# Patient Record
Sex: Male | Born: 1955 | Hispanic: No | Marital: Married | Smoking: Never smoker
Health system: Southern US, Community
[De-identification: ages and names within clinical notes are randomized; demographics above are authoritative.]

---

## 2015-10-25 ENCOUNTER — Ambulatory Visit (INDEPENDENT_AMBULATORY_CARE_PROVIDER_SITE_OTHER): Payer: BLUE CROSS/BLUE SHIELD

## 2015-10-25 ENCOUNTER — Ambulatory Visit (INDEPENDENT_AMBULATORY_CARE_PROVIDER_SITE_OTHER): Payer: BLUE CROSS/BLUE SHIELD | Admitting: Sports Medicine

## 2015-10-25 DIAGNOSIS — M5136 Other intervertebral disc degeneration, lumbar region: Secondary | ICD-10-CM | POA: Diagnosis not present

## 2015-10-25 DIAGNOSIS — M545 Low back pain: Secondary | ICD-10-CM

## 2015-10-25 DIAGNOSIS — M51369 Other intervertebral disc degeneration, lumbar region without mention of lumbar back pain or lower extremity pain: Secondary | ICD-10-CM | POA: Insufficient documentation

## 2015-10-25 MED ORDER — MELOXICAM 15 MG PO TABS: ORAL_TABLET | ORAL | 3 refills | Status: DC

## 2015-10-25 MED ORDER — METHYLPREDNISOLONE SODIUM SUCC 125 MG IJ SOLR
125.0000 mg | Freq: Once | INTRAMUSCULAR | Status: AC
  Administered 2015-10-25: 125 mg via INTRAMUSCULAR

## 2015-10-25 MED ORDER — KETOROLAC TROMETHAMINE 30 MG/ML IJ SOLN
30.0000 mg | Freq: Once | INTRAMUSCULAR | Status: AC
  Administered 2015-10-25: 30 mg via INTRAMUSCULAR

## 2015-10-25 MED ORDER — PREDNISONE 50 MG PO TABS: ORAL_TABLET | ORAL | 0 refills | Status: DC

## 2015-10-25 NOTE — Assessment & Plan Note (Signed)
Classic discogenic symptoms. Physical therapy, prednisone, meloxicam, x-rays. Referral to physical therapy and chiropractic care. Solu-Medrol 125 and Toradol 30 mg given intramuscular.

## 2015-10-25 NOTE — Progress Notes (Signed)
   Subjective:    I'm seeing this patient as a consultation for:  Rada Hayasey Melvin PA-C  CC: Low back pain  HPI: For several days this pleasant 60 year old male has had increasing pain in his low back, worse with sitting, flexion, Valsalva. No radicular pain, constitutional symptoms, trauma, no saddle numbness. No bowel or bladder dysfunction with the exception of difficulty stooling when he Valsalvas, secondary to pain.  Past medical history, Surgical history, Family history not pertinant except as noted below, Social history, Allergies, and medications have been entered into the medical record, reviewed, and no changes needed.   Review of Systems: No headache, visual changes, nausea, vomiting, diarrhea, constipation, dizziness, abdominal pain, skin rash, fevers, chills, night sweats, weight loss, swollen lymph nodes, body aches, joint swelling, muscle aches, chest pain, shortness of breath, mood changes, visual or auditory hallucinations.   Objective:   General: Well Developed, well nourished, and in no acute distress.  Neuro/Psych: Alert and oriented x3, extra-ocular muscles intact, able to move all 4 extremities, sensation grossly intact. Skin: Warm and dry, no rashes noted.  Respiratory: Not using accessory muscles, speaking in full sentences, trachea midline.  Cardiovascular: Pulses palpable, no extremity edema. Abdomen: Does not appear distended. Back Exam:  Inspection: Unremarkable  Motion: Limited significantly to just slight flexion, extension and rotation is okay. SLR laying: Produces back pain but not radiating leg pain  XSLR laying: Negative  Palpable tenderness: None. FABER: negative. Sensory change: Gross sensation intact to all lumbar and sacral dermatomes.  Reflexes: 2+ at both patellar tendons, 2+ at achilles tendons, Babinski's downgoing.  Strength at foot  Plantar-flexion: 5/5 Dorsi-flexion: 5/5 Eversion: 5/5 Inversion: 5/5  Leg strength  Quad: 5/5 Hamstring: 5/5 Hip  flexor: 5/5 Hip abductors: 5/5  Gait unremarkable.  Impression and Recommendations:   This case required medical decision making of moderate complexity.  Lumbar degenerative disc disease Classic discogenic symptoms. Physical therapy, prednisone, meloxicam, x-rays. Referral to physical therapy and chiropractic care. Solu-Medrol 125 and Toradol 30 mg given intramuscular.

## 2015-11-06 ENCOUNTER — Ambulatory Visit (INDEPENDENT_AMBULATORY_CARE_PROVIDER_SITE_OTHER): Payer: BLUE CROSS/BLUE SHIELD | Admitting: Rehabilitative and Restorative Service Providers"

## 2015-11-06 ENCOUNTER — Encounter: Payer: Self-pay | Admitting: Rehabilitative and Restorative Service Providers"

## 2015-11-06 DIAGNOSIS — M545 Low back pain, unspecified: Secondary | ICD-10-CM

## 2015-11-06 DIAGNOSIS — R29898 Other symptoms and signs involving the musculoskeletal system: Secondary | ICD-10-CM | POA: Diagnosis not present

## 2015-11-06 DIAGNOSIS — R293 Abnormal posture: Secondary | ICD-10-CM | POA: Diagnosis not present

## 2015-11-06 NOTE — Patient Instructions (Signed)
Abdominal Bracing With Pelvic Floor (Hook-Lying)    With neutral spine, tighten pelvic floor and abdominals sucking belly button to back bone; tighten muscles in low back at waist. Hold 10 sec  Repeat _10__ times. Do _several__ times a day. Progress to do this in sitting; standing; walking and with any lifting   Copyright  VHI. All rights reserved.    Trunk: Prone Extension (Press-Ups)    Lie on stomach on firm, flat surface. Relax bottom and legs. Raise chest in air with elbows straight. Keep hips flat on surface, sag stomach. Hold _2___ seconds. Repeat __10__ times. Do __2-3__ sessions per day. CAUTION: Movement should be gentle and slow.   HIP: Hamstrings - Supine   Place strap around foot. Raise leg up, keeping knee straight.  Bend opposite knee to protect back if indicated. Hold 30 seconds. 3 reps per set, 2-3 sets per day     Outer Hip Stretch: Reclined IT Band Stretch (Strap)   Strap around one foot, pull leg across body until you feel a pull or stretch, with shoulders on mat. Hold for 30 seconds. Repeat 3 times each leg. 2-3 times/day.  Piriformis Stretch   Lying on back, pull right knee toward opposite shoulder. Hold 30 seconds. Repeat 3 times. Do 2-3 sessions per day.  Quads / HF, Prone   Lie face down. Grasp one ankle with same-side hand. Use towel if needed to reach. Gently pull foot toward buttock.  Hold 30 seconds. Repeat 3 times per session. Do 2-3 sessions per day.  Sleeping on Back  Place pillow under knees. A pillow with cervical support and a roll around waist are also helpful. Copyright  VHI. All rights reserved.  Sleeping on Side Place pillow between knees. Use cervical support under neck and a roll around waist as needed. Copyright  VHI. All rights reserved.   Sleeping on Stomach   If this is the only desirable sleeping position, place pillow under lower legs, and under stomach or chest as needed.  Posture - Sitting   Sit upright,  head facing forward. Try using a roll to support lower back. Keep shoulders relaxed, and avoid rounded back. Keep hips level with knees. Avoid crossing legs for long periods. Stand to Sit / Sit to Stand   To sit: Bend knees to lower self onto front edge of chair, then scoot back on seat. To stand: Reverse sequence by placing one foot forward, and scoot to front of seat. Use rocking motion to stand up.   Work Height and Reach  Ideal work height is no more than 2 to 4 inches below elbow level when standing, and at elbow level when sitting. Reaching should be limited to arm's length, with elbows slightly bent.  Bending  Bend at hips and knees, not back. Keep feet shoulder-width apart.    Posture - Standing   Good posture is important. Avoid slouching and forward head thrust. Maintain curve in low back and align ears over shoul- ders, hips over ankles.  Alternating Positions   Alternate tasks and change positions frequently to reduce fatigue and muscle tension. Take rest breaks. Computer Work   Position work to Art gallery managerface forward. Use proper work and seat height. Keep shoulders back and down, wrists straight, and elbows at right angles. Use chair that provides full back support. Add footrest and lumbar roll as needed.  Getting Into / Out of Car  Lower self onto seat, scoot back, then bring in one leg at a time. Reverse sequence  to get out.  Dressing  Lie on back to pull socks or slacks over feet, or sit and bend leg while keeping back straight.    Housework - Sink  Place one foot on ledge of cabinet under sink when standing at sink for prolonged periods.   Pushing / Pulling  Pushing is preferable to pulling. Keep back in proper alignment, and use leg muscles to do the work.  Deep Squat   Squat and lift with both arms held against upper trunk. Tighten stomach muscles without holding breath. Use smooth movements to avoid jerking.  Avoid Twisting   Avoid twisting or bending  back. Pivot around using foot movements, and bend at knees if needed when reaching for articles.  Carrying Luggage   Distribute weight evenly on both sides. Use a cart whenever possible. Do not twist trunk. Move body as a unit.   Lifting Principles .Maintain proper posture and head alignment. .Slide object as close as possible before lifting. .Move obstacles out of the way. .Test before lifting; ask for help if too heavy. .Tighten stomach muscles without holding breath. .Use smooth movements; do not jerk. .Use legs to do the work, and pivot with feet. .Distribute the work load symmetrically and close to the center of trunk. .Push instead of pull whenever possible.   Ask For Help   Ask for help and delegate to others when possible. Coordinate your movements when lifting together, and maintain the low back curve.  Log Roll   Lying on back, bend left knee and place left arm across chest. Roll all in one movement to the right. Reverse to roll to the left. Always move as one unit. Housework - Sweeping  Use long-handled equipment to avoid stooping.   Housework - Wiping  Position yourself as close as possible to reach work surface. Avoid straining your back.  Laundry - Unloading Wash   To unload small items at bottom of washer, lift leg opposite to arm being used to reach.  Gardening - Raking  Move close to area to be raked. Use arm movements to do the work. Keep back straight and avoid twisting.     Cart  When reaching into cart with one arm, lift opposite leg to keep back straight.   Getting Into / Out of Bed  Lower self to lie down on one side by raising legs and lowering head at the same time. Use arms to assist moving without twisting. Bend both knees to roll onto back if desired. To sit up, start from lying on side, and use same move-ments in reverse. Housework - Vacuuming  Hold the vacuum with arm held at side. Step back and forth to move it, keeping head up.  Avoid twisting.   Laundry - Armed forces training and education officer so that bending and twisting can be avoided.   Laundry - Unloading Dryer  Squat down to reach into clothes dryer or use a reacher.  Gardening - Weeding / Psychiatric nurse or Kneel. Knee pads may be helpful.                    TENS UNIT: This is helpful for muscle pain and spasm.   Search and Purchase a TENS 7000 2nd edition at www.tenspros.com. It should be less than $30.     TENS unit instructions: Do not shower or bathe with the unit on Turn the unit off before removing electrodes or batteries If the electrodes lose stickiness add a drop of  water to the electrodes after they are disconnected from the unit and place on plastic sheet. If you continued to have difficulty, call the TENS unit company to purchase more electrodes. Do not apply lotion on the skin area prior to use. Make sure the skin is clean and dry as this will help prolong the life of the electrodes. After use, always check skin for unusual red areas, rash or other skin difficulties. If there are any skin problems, does not apply electrodes to the same area. Never remove the electrodes from the unit by pulling the wires. Do not use the TENS unit or electrodes other than as directed. Do not change electrode placement without consultating your therapist or physician. Keep 2 fingers with between each electrode.

## 2015-11-06 NOTE — Therapy (Signed)
[facility] [address] [place], [place], [address] Phone: [phone]   Fax:  [phone]  Physical Therapy Evaluation  Patient Details  Name: Marcus BurkeManojkumar Amratlal Myers MRN: [id number] Date of Birth: 1955-09-18 Referring Provider: Dr. Benjamin Stainhekkekandam  Encounter Date: 11/06/2015      PT End of Session - 11/06/15 1504    Visit Number 1   Number of Visits 12   Date for PT Re-Evaluation 12/19/15   PT Start Time 0211   PT Stop Time 0308   PT Time Calculation (min) 57 min   Activity Tolerance Patient tolerated treatment well      History reviewed. No pertinent past medical history.  History reviewed. No pertinent surgical history.  There were no vitals filed for this visit.       Subjective Assessment - 11/06/15 1417    Subjective Patient reports that he has problems bending forward to touch toes; sitting in floor for more than 5-10 min; bending forward to tie shoes. Symptoms have been present for the past two weeks. he was bent forward removing water from a fountain for 15-20 min. He has some pain with valsalva.    Pertinent History Denies any back or musculoskeletal problems.   How long can you sit comfortably? 5-10 min sitting cross legged; no problem kneeling; in a chair > 30 min; sleeping on his stomach   How long can you stand comfortably? no problems    How long can you walk comfortably? no problems    Diagnostic tests xray - WNL's    Patient Stated Goals get rid of the back pain    Currently in Pain? No/denies   Pain Location Back   Pain Orientation Lower;Left;Right   Pain Descriptors / Indicators Aching   Pain Type Acute pain   Pain Radiating Towards none   Pain Onset 1 to 4 weeks ago   Pain Frequency Intermittent   Aggravating Factors  bending forward; sitting cross legged; prolonged sitting    Pain Relieving Factors changing positions             [facility] - 11/06/15 0001      Assessment   Medical Diagnosis Lumbar DDD   Referring Provider Dr. Benjamin Stainhekkekandam   Onset Date/Surgical Date 10/23/15   Hand Dominance Right   Next MD Visit 9/17 - 4 weeks    Prior Therapy none     Precautions   Precautions None     Balance Screen   Has the patient fallen in the past 6 months No   Has the patient had a decrease in activity level because of a fear of falling?  No   Is the patient reluctant to leave their home because of a fear of falling?  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    Leisure sedentary lifestyle watches TV; some yard work when his back is not hurting      Observation/Other Assessments   Focus on Therapeutic Outcomes (FOTO)  37% limitation      Sensation   Additional Comments WNL's per pt report      Posture/Postural Control   Posture Comments head forward; shoulders rounded and elevated; increased thoracic kyphosis; decresaed lumbar lordosis     AROM   Lumbar Flexion  90%  discomfort   Lumbar Extension 30%   Lumbar - Right Side Bend 55%  pull Lt side   Lumbar - Left Side Bend 55%  discomfort Lt side   Lumbar - Right Rotation 50%  discomfort Lt    Lumbar - Left Rotation 55%  discomfort Lt      Strength   Overall Strength Comments 5/5 bilat LE's      Flexibility   Hamstrings tight 80 deg Rt; 75 deg Lt    Quadriceps tight bilat   ITB tight bilat Lt > Rt    Piriformis tight bilat Lt > Rt      Palpation   Spinal mobility mild discomfort L5/S1 with CPA mobs Grade III/ (-) UPA    Palpation comment tender and tight Lt > Rt piriformis/hip abductor musculature                    [facility] Adult PT Treatment/Exercise - 11/06/15 0001      Lumbar Exercises: Stretches   Passive Hamstring Stretch 2 reps;30 seconds   Press Ups --  2-3 sec x  10   Quad Stretch 2 reps;30 seconds   ITB Stretch 2 reps;30 seconds   Piriformis Stretch 2 reps;30 seconds     Lumbar Exercises: Supine   Ab Set --  3 part core 10 sec x 10      Moist Heat Therapy   Number Minutes Moist Heat 15 Minutes   Moist Heat Location Lumbar Spine     Electrical Stimulation   Electrical Stimulation Location bilat lumbar spine    Electrical Stimulation Action IFC   Electrical Stimulation Parameters to tolerance   Electrical Stimulation Goals Pain;Tone                PT Education - 11/06/15 1503    Education provided Yes   Education Details initiated spine care; HEP   Person(s) Educated Patient   Methods Explanation;Demonstration;Tactile cues;Verbal cues;Handout   Comprehension Verbalized understanding;Returned demonstration;Verbal cues required;Tactile cues required             PT Long Term Goals - 11/06/15 1413      PT LONG TERM GOAL #1   Title Improve posture and alignment with patient to demonstrate more upright posture  12/19/15   Time 6   Period Weeks   Status New     PT LONG TERM GOAL #2   Title Increase LE flexibility to WFL's and equal bilat 12/19/15   Time 6   Period Weeks   Status New     PT LONG TERM GOAL #3   Title Decrease pain with patient reporting ability to sit cross legged for 10-15 min without pain or limitation 12/19/15   Time 6   Period Weeks   Status New     PT LONG TERM GOAL #4   Title Independent in HEP 12/19/15   Time 6   Period Weeks   Status New     PT LONG TERM GOAL #5   Title Improve FOTO to </= 21% limitation 12/19/15   Time 6   Period Weeks   Status New               Plan - 11/06/15 1504    Clinical Impression Statement Patient presents with central LBP of ~2 weeks duration.Ssymptoms are intermittent in nature and related to forward flexed postures. Patient began to experience symptoms after being bent forward removing water from a fountain for 15-20 min. He has limited  spinal mobilty;  tightness through bilat LE's; pain with spring testing through the lumbar spine; poor core stability; limited understanding of body mechanics and spine care.    Rehab Potential Good   PT Frequency 2x / week   PT Duration 6 weeks   PT Treatment/Interventions Patient/family education;ADLs/Self Care Home Management;Cryotherapy;Electrical Stimulation;Iontophoresis 4mg /ml Dexamethasone;Moist Heat;Traction;Ultrasound;Manual techniques;Dry needling;Therapeutic activities;Therapeutic exercise   PT Next Visit Plan progress wit hcore stabilization with focus on extension; back education re posture; alignment; lifting    Consulted and Agree with Plan of Care Patient      Patient will benefit from skilled therapeutic intervention in order to improve the following deficits and impairments:  Postural dysfunction, Improper body mechanics, Pain, Decreased range of motion, Decreased mobility, Decreased activity tolerance  Visit Diagnosis: Bilateral low back pain without sciatica - Plan: PT plan of care cert/re-cert  Other symptoms and signs involving the musculoskeletal system - Plan: PT plan of care cert/re-cert  Abnormal posture - Plan: PT plan of care cert/re-cert     Problem List Patient Active Problem List   Diagnosis Date Noted  . Lumbar degenerative disc disease 10/25/2015    Karrington Studnicka Rober MinionP Cathan Gearin PT, MPH  11/06/2015, 3:13 PM  [facility] [address] [place], [place], [address] Phone: [phone]   Fax:  [phone]  Name: Marcus BurkeManojkumar Amratlal Myers MRN: [id number] Date of Birth: 1955/12/30

## 2015-11-13 ENCOUNTER — Ambulatory Visit (INDEPENDENT_AMBULATORY_CARE_PROVIDER_SITE_OTHER): Payer: BLUE CROSS/BLUE SHIELD | Admitting: Physical Therapy

## 2015-11-13 DIAGNOSIS — M545 Low back pain, unspecified: Secondary | ICD-10-CM

## 2015-11-13 DIAGNOSIS — R293 Abnormal posture: Secondary | ICD-10-CM | POA: Diagnosis not present

## 2015-11-13 DIAGNOSIS — R29898 Other symptoms and signs involving the musculoskeletal system: Secondary | ICD-10-CM

## 2015-11-13 NOTE — Therapy (Addendum)
[address] [place], [place], [address] Phone: [phone]   Fax:  [phone]  Physical Therapy Treatment  Patient Details  Name: Tierra Divelbiss MRN: [id number] Date of Birth: 09-Dec-1955 Referring Provider: Dr. Helane Rima  Encounter Date: 11/13/2015      PT End of Session - 11/13/15 1608    Visit Number 2   Number of Visits 12   Date for PT Re-Evaluation 12/19/15   PT Start Time 1600   PT Stop Time 0109   PT Time Calculation (min) 53 min   Activity Tolerance Patient tolerated treatment well;No increased pain      No past medical history on file.  No past surgical history on file.  There were no vitals filed for this visit.      Subjective Assessment - 11/13/15 1604    Subjective Pt reports he has no problem standing all day at work, but continues to have pain with touching feet to put shoes on or sitting for extended period of time.   Patient Stated Goals get rid of the back pain    Currently in Pain? Yes   Pain Score 3    Pain Location Back   Pain Orientation Right;Left   Pain Descriptors / Indicators Aching   Aggravating Factors  bending forward    Pain Relieving Factors changing positions, ice            [facility] PT Assessment - 11/13/15 0001      Assessment   Medical Diagnosis Lumbar DDD   Referring Provider Dr. Helane Rima   Onset Date/Surgical Date 10/23/15   Hand Dominance Right   Next MD Visit 9/17 - 4 weeks    Prior Therapy none     Flexibility   Hamstrings Rt/Lt deg 90 deg (opp leg bent)            [facility] Adult PT Treatment/Exercise - 11/13/15 0001      Lumbar Exercises: Stretches   Passive Hamstring Stretch 3 reps;30 seconds   Prone on Elbows Stretch 5 reps;10 seconds   Press Ups 3 reps  3 sec hold. Switched to POE   [employer] 2 reps;30 seconds   ITB Stretch 2 reps;30 seconds   Piriformis Stretch 2 reps;30 seconds     Lumbar Exercises: Aerobic    Stationary Bike NuStep L4: 5 min      Lumbar Exercises: Seated   Sit to Stand 5 reps  with ab set      Lumbar Exercises: Supine   Ab Set 10 reps;5 seconds     Modalities   Modalities Electrical Stimulation;Moist Heat     Moist Heat Therapy   Number Minutes Moist Heat 15 Minutes   Moist Heat Location Lumbar Spine     Electrical Stimulation   Electrical Stimulation Location bilat lumbar spine    Electrical Stimulation Action IFC   Electrical Stimulation Parameters to tolerance    Electrical Stimulation Goals Pain;Tone         Education     Educated pt on back care including supine to/from sit; lumbar support in sitting and avoiding agrivating factors like forward trunk flexion with straight legs. Pt verbalized understanding.                 PT Long Term Goals - 11/13/15 1643      PT LONG TERM GOAL #1   Title Improve posture and alignment with patient to demonstrate more upright posture  12/19/15   Time 6  Period Weeks   Status On-going     PT LONG TERM GOAL #2   Title Increase LE flexibility to WFL's and equal bilat 12/19/15   Time 6   Period Weeks   Status On-going     PT LONG TERM GOAL #3   Title Decrease pain with patient reporting ability to sit cross legged for 10-15 min without pain or limitation 12/19/15   Time 6   Period Weeks   Status On-going     PT LONG TERM GOAL #4   Title Independent in HEP 12/19/15   Time 6   Period Weeks   Status On-going     PT LONG TERM GOAL #5   Title Improve FOTO to </= 21% limitation 12/19/15   Time 6   Period Weeks               Plan - 11/13/15 1631    Clinical Impression Statement Pt required some minor cues on correct form of exercise today.  He reported decrease in back pain with stretches and further reduction with estim/MHP.  Pt re-educated on spine care and body mechanics.  No goals met yet, only second visit.    Rehab Potential Good   PT Frequency 2x / week   PT Duration 6 weeks   PT  Treatment/Interventions Patient/family education;ADLs/Self Care Home Management;Cryotherapy;Electrical Stimulation;Iontophoresis 69m/ml Dexamethasone;Moist Heat;Traction;Ultrasound;Manual techniques;Dry needling;Therapeutic activities;Therapeutic exercise   PT Next Visit Plan progress with core stabilization with focus on extension; back education re posture; alignment; lifting    Consulted and Agree with Plan of Care Patient      Patient will benefit from skilled therapeutic intervention in order to improve the following deficits and impairments:  Postural dysfunction, Improper body mechanics, Pain, Decreased range of motion, Decreased mobility, Decreased activity tolerance  Visit Diagnosis: Bilateral low back pain without sciatica  Other symptoms and signs involving the musculoskeletal system  Abnormal posture     Problem List Patient Active Problem List   Diagnosis Date Noted  . Lumbar degenerative disc disease 10/25/2015   JKerin Perna PTA 11/13/15 4:52 PM  [facility] [place] [address]Phone: [phone]  Fax:  [phone] Name: MBraheem TomasikMRN: [id number] of Birth: 1August 09, 1957 PHYSICAL THERAPY DISCHARGE SUMMARY  Visits from Start of Care: 2  Current functional level related to goals / functional outcomes: unknown   Remaining deficits: unknown   Education / Equipment: HEP Plan: Patient agrees to discharge.  Patient goals were not met. Patient is being discharged due to not returning since the last visit.  ?????     Celyn P. HHelene KelpPT, MPH 02/11/16 9:49 AM

## 2015-11-15 ENCOUNTER — Encounter: Payer: BLUE CROSS/BLUE SHIELD | Admitting: Rehabilitative and Restorative Service Providers"

## 2018-06-13 IMAGING — DX DG LUMBAR SPINE COMPLETE 4+V
5 series · 5 of 5 positions shown · non-contrast
Comparison: None in PACs

CLINICAL DATA: Low back pain for several days with no known injury.
No radicular symptoms.

EXAM:
LUMBAR SPINE - COMPLETE 4+ VIEW

[l-spine ap]
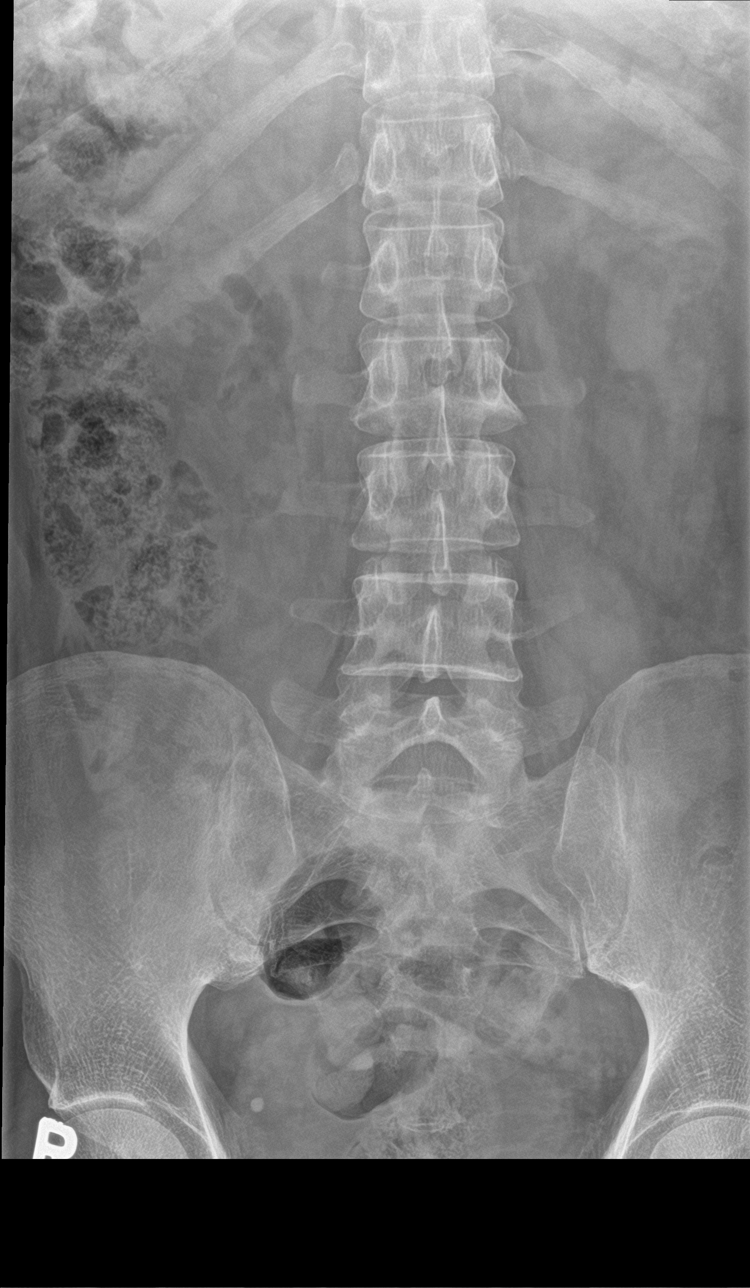

[l-spine obl (1 of 2)]
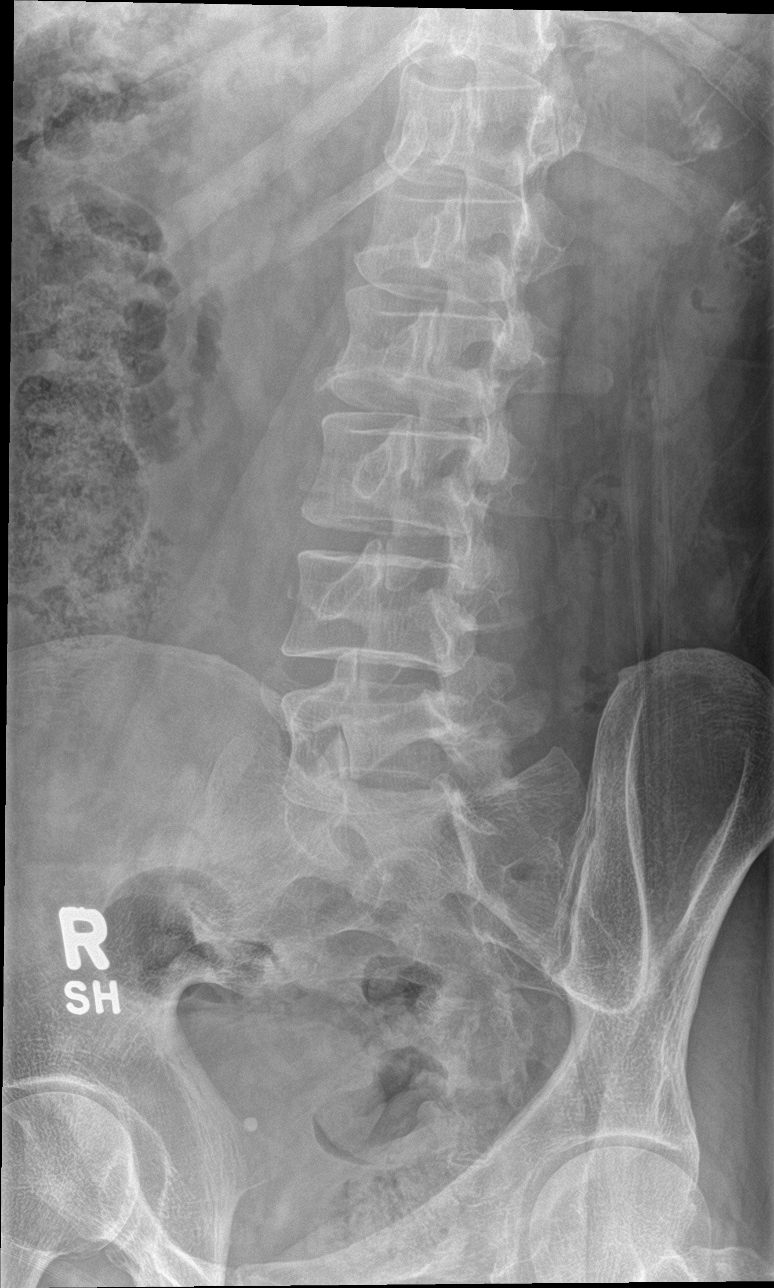

[l-spine obl (2 of 2)]
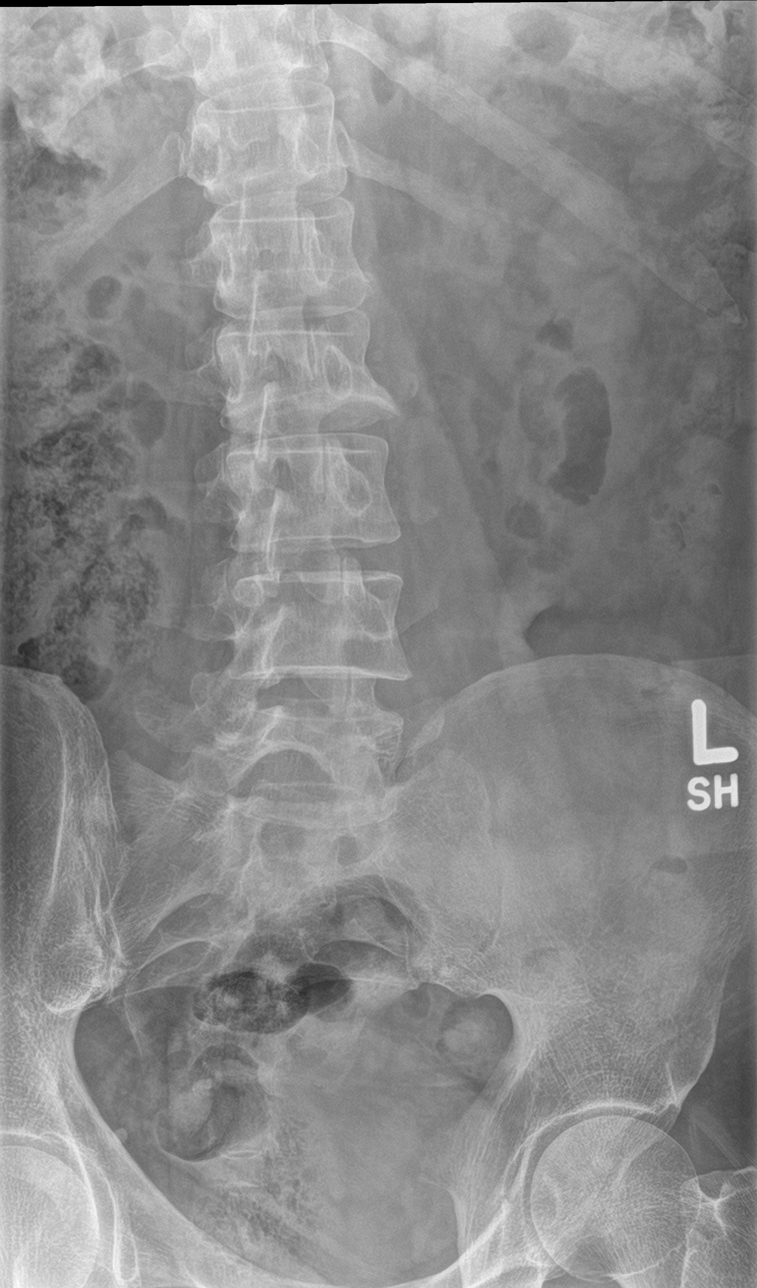

[l-spine lat]
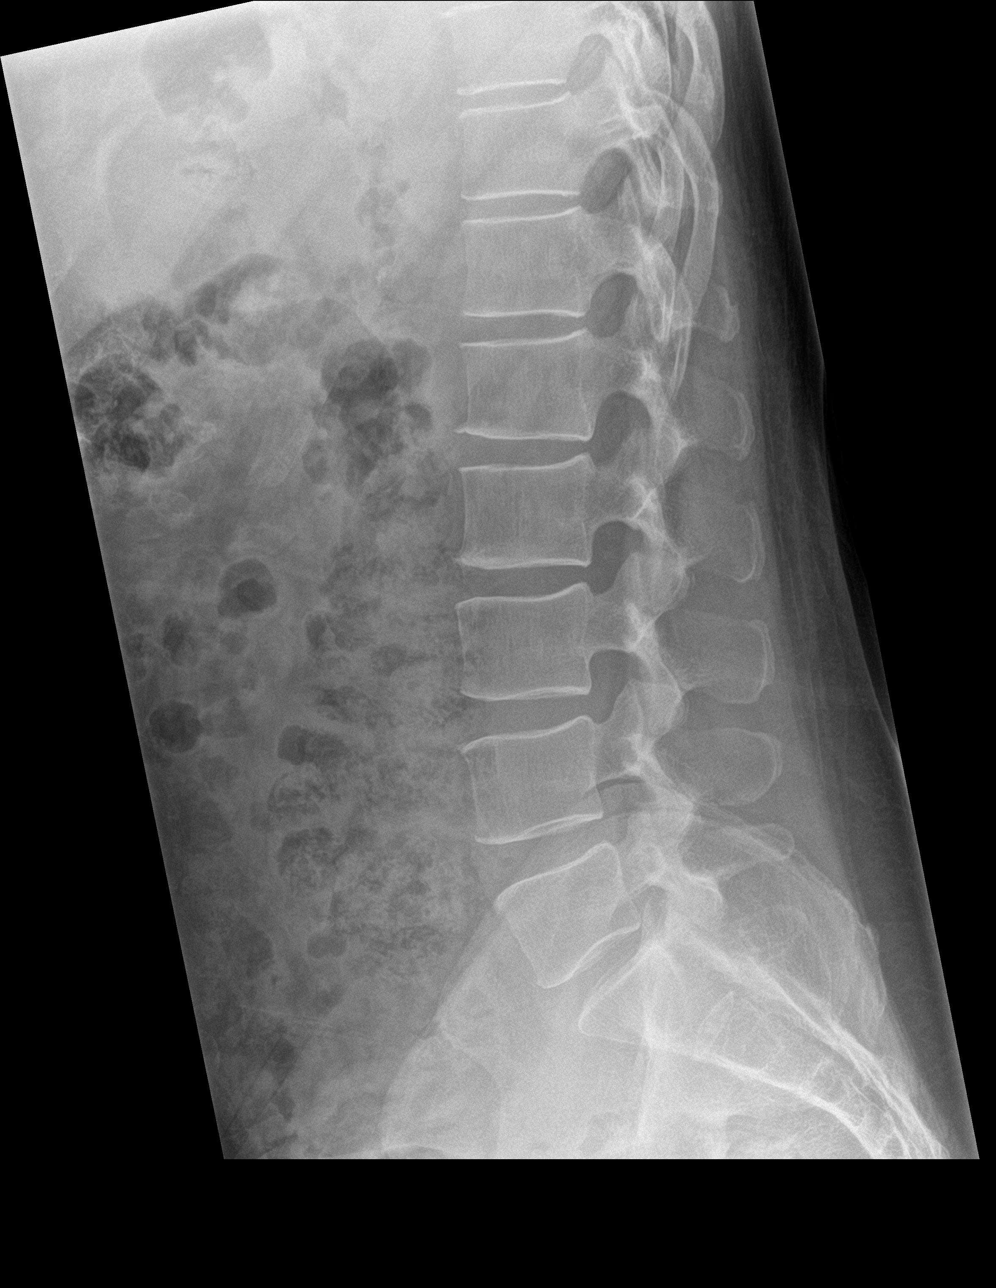

[l-spine spot]
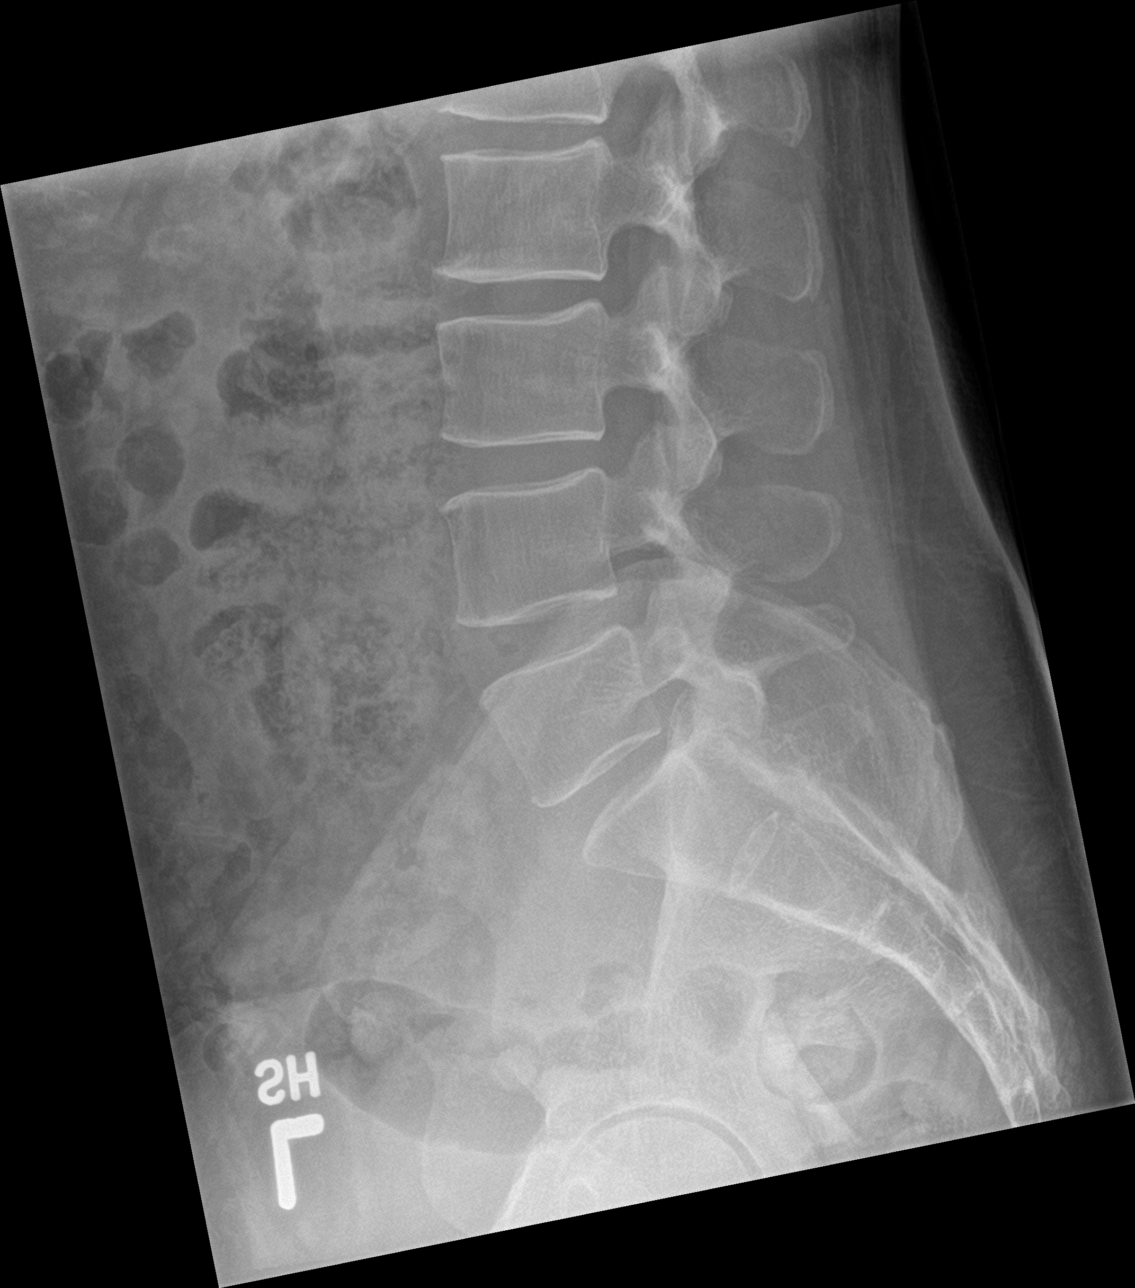

[5 of 5 positions shown; findings below may reference images not displayed]

FINDINGS: The bones are subjectively adequately mineralized. The vertebral
bodies are preserved in height. The pedicles and transverse
processes are intact. There is no pars defect and no
spondylolisthesis. The disc space heights are well maintained. The
observed portions of the sacrum are normal.
IMPRESSION: There is no acute or significant chronic bony abnormality of the
lumbar spine.

## 2018-10-17 ENCOUNTER — Other Ambulatory Visit: Payer: Self-pay

## 2018-10-17 ENCOUNTER — Encounter: Payer: Self-pay | Admitting: Family Medicine

## 2018-10-17 ENCOUNTER — Ambulatory Visit (INDEPENDENT_AMBULATORY_CARE_PROVIDER_SITE_OTHER): Payer: BC Managed Care – PPO

## 2018-10-17 ENCOUNTER — Ambulatory Visit (INDEPENDENT_AMBULATORY_CARE_PROVIDER_SITE_OTHER): Payer: BC Managed Care – PPO | Admitting: Family Medicine

## 2018-10-17 VITALS — BP 148/72 | HR 63 | Temp 97.6°F | Wt 172.0 lb

## 2018-10-17 DIAGNOSIS — M25562 Pain in left knee: Secondary | ICD-10-CM | POA: Diagnosis not present

## 2018-10-17 MED ORDER — DICLOFENAC SODIUM 1 % TD GEL: 4.0000 g | Freq: Four times a day (QID) | TRANSDERMAL | 11 refills | Status: AC

## 2018-10-17 NOTE — Progress Notes (Signed)
Marcus Myers is a 63 y.o. male who [facility] today for left knee pain for 1 week. Patient mentioned hiking [place] as a possible initial exacerbating event. Pain is localized to medial joint. Patient is on his feet all day for his work at [employer] and the pain gets worse throughout the day. Pain is a 4/10, wakes him from sleep, and is accompanied by reduced knee flexion. Patient reports intermittent knee effusions. Patient limps intermittently. Patient only has been taking glucosamine which has not helped.    ROS:  As above  Exam:  BP (!) 148/72   Pulse 63   Temp 97.6 F (36.4 C) (Oral)   Wt 172 lb (78 kg)  Wt Readings from Last 5 Encounters:  10/17/18 172 lb (78 kg)  10/25/15 170 lb 6.4 oz (77.3 kg)   General: Well Developed, well nourished, and in no acute distress.  Neuro/Psych: Alert and oriented x3, extra-ocular muscles intact, able to move all 4 extremities, sensation grossly intact. Skin: Warm and dry, no rashes noted.  Respiratory: Not using accessory muscles, speaking in full sentences, trachea midline.  Cardiovascular: Pulses palpable, no extremity edema. Abdomen: Does not appear distended. MSK:  Left Knee: Mild effusion. No tenderness to palpation. Normal ROM. Negative anterior and posterior drawer. Negative valgus, varus, and McMurray's test. Walks with limp.  Intact strength.    Lab and Radiology Results X-ray images left knee obtained today personally independently reviewed. Mild medial compartment DJD.  No acute fractures. Await formal radiology over read  Limited musculoskeletal ultrasound left knee reveals intact quad and patella tendon with mild effusion.  Intact medial and lateral meniscus to ultrasound with well-preserved joint space.  Normal bony structures.    Assessment and Plan: 63 y.o. male with left knee pain and effusion for one week after hiking. X-ray and US  showed good joint space, intact meniscus, absence of fractures, and mild effusion. 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 Recommend daily quad strengthening exercises (straight leg raises, toe out straight leg raises, and knee extension with ankle weight), decreased activity, use of a compression sleeve at work to reduce swelling. Prescribed Diclofenac gel and ice for pain. If not improved in 4 weeks, patient can consider returning for further eval and/or steroid injection.   PDMP not reviewed this encounter. Orders Placed This Encounter  Procedures  . DG Knee Complete 4 Views Left    Please include patellar sunrise, lateral, and weightbearing bilateral AP and bilateral rosenberg views    Standing Status:   Future    Number of Occurrences:   1    Standing Expiration Date:   12/17/2019    Order Specific Question:   Reason for exam:    Answer:   Please include patellar sunrise, lateral, and weightbearing bilateral AP and bilateral rosenberg views    Comments:   Please include patellar sunrise, lateral, and weightbearing bilateral AP and bilateral rosenberg views    Order Specific Question:   Preferred imaging location?    Answer[facility]  . DG Knee 1-2 Views Right    Standing Status:   Future    Number of Occurrences:   1    Standing Expiration Date:   12/18/2019    Order Specific Question:   Reason for Exam (SYMPTOM  OR DIAGNOSIS REQUIRED)    Answer:   For use with the left knee x-ray bilateral AP and Zoila Shutterosenberg  standing.    Order Specific Question:   Preferred imaging location?    Answer:   [facility]   Meds ordered this encounter  Medications  . diclofenac sodium (VOLTAREN) 1 % GEL    Sig: Apply 4 g topically 4 (four) times daily. To affected joint.    Dispense:  100 g    Refill:  11    Historical information moved to improve visibility of documentation.  No past medical history on file. No past surgical history on file.  Social History   Tobacco Use  . Smoking status: Never Smoker  . Smokeless tobacco: Never Used  Substance Use Topics  . Alcohol use: Not on file   family history is not on file.  Medications: Current Outpatient Medications  Medication Sig Dispense Refill  . glucosamine-chondroitin 500-400 MG tablet Take 1 tablet by mouth 3 (three) times daily.    . diclofenac sodium (VOLTAREN) 1 % GEL Apply 4 g topically 4 (four) times daily. To affected joint. 100 g 11   No current facility-administered medications for this visit.    No Known Allergies    Discussed warning signs or symptoms. Please see discharge instructions. Patient expresses understanding.  I personally was present and performed or re-performed the history, physical exam and medical decision-making activities of this service and have verified that the service and findings are accurately documented in the student's note. ___________________________________________ Lynne Leader M.D., ABFM., CAQSM. Primary Care and Sports Medicine Adjunct Instructor of [employer]

## 2018-10-17 NOTE — Patient Instructions (Addendum)
Thank you for coming in today. Marcus Myers.  Apply the diclofenac gel 4x daily for pain as needed.  Use compression sleeve during work.  Consider ice.  Work on leg exercises.  Straight leg raises and toe out straight leg raise Work on knee extension with ankle weight.  If not improving return in 4 weeks and we can do more including a shot.   If you are worse you can return sooner.    Acute Knee Pain, Adult Many things can cause knee pain. Sometimes, knee pain is sudden (acute) and may be caused by damage, swelling, or irritation of the muscles and tissues that support your knee. The pain often goes away on its own with time and rest. If the pain does not go away, tests may be done to find out what is causing the pain. Follow these instructions at home: Pay attention to any changes in your symptoms. Take these actions to relieve your pain. If you have a knee sleeve or brace:   Wear the sleeve or brace as told by your doctor. Remove it only as told by your doctor.  Loosen the sleeve or brace if your toes: ? Tingle. ? Become numb. ? Turn cold and blue.  Keep the sleeve or brace clean.  If the sleeve or brace is not waterproof: ? Do not let it get wet. ? Cover it with a watertight covering when you take a bath or shower. Activity  Rest your knee.  Do not do things that cause pain.  Avoid activities where both feet leave the ground at the same time (high-impact activities). Examples are running, jumping rope, and doing jumping jacks.  Work with a physical therapist to make a safe exercise program, as told by your doctor. Managing pain, stiffness, and swelling   If told, put ice on the knee: ? Put ice in a plastic bag. ? Place a towel between your skin and the bag. ? Leave the ice on for 20 minutes, 2-3 times a day.  If told, put pressure (compression) on your injured knee to control swelling, give support, and help with discomfort. Compression may be done with an elastic  bandage. General instructions  Take all medicines only as told by your doctor.  Raise (elevate) your knee while you are sitting or lying down. Make sure your knee is higher than your heart.  Sleep with a pillow under your knee.  Do not use any products that contain nicotine or tobacco. These include cigarettes, e-cigarettes, and chewing tobacco. These products may slow down healing. If you need help quitting, ask your doctor.  If you are overweight, work with your doctor and a food expert (dietitian) to set goals to lose weight. Being overweight can make your knee hurt more.  Keep all follow-up visits as told by your doctor. This is important. Contact a doctor if:  The knee pain does not stop.  The knee pain changes or gets worse.  You have a fever along with knee pain.  Your knee feels warm when you touch it.  Your knee gives out or locks up. Get help right away if:  Your knee swells, and the swelling gets worse.  You cannot move your knee.  You have very bad knee pain. Summary  Many things can cause knee pain. The pain often goes away on its own with time and rest.  Your doctor may do tests to find out the cause of the pain.  Pay attention to any changes in your  symptoms. Relieve your pain with rest, medicines, light activity, and use of ice.  Get help right away if you cannot move your knee or your knee pain is very bad. This information is not intended to replace advice given to you by your health care provider. Make sure you discuss any questions you have with your health care provider. Document Released: 05/29/2008 Document Revised: 08/12/2017 Document Reviewed: 08/12/2017 Elsevier Patient Education  2020 [employer].

## 2019-06-01 ENCOUNTER — Ambulatory Visit: Payer: BC Managed Care – PPO | Attending: Internal Medicine

## 2019-06-01 DIAGNOSIS — Z23 Encounter for immunization: Secondary | ICD-10-CM

## 2019-06-01 NOTE — Progress Notes (Signed)
   Covid-19 Vaccination Clinic  Name:  Marcus Myers    MRN: [id number] DOB: 06-Aug-1955  06/01/2019  [name] was observed post Covid-19 immunization for 15 minutes without incident. He was provided with Vaccine Information Sheet and instruction to access the V-Safe system.   [name] was instructed to call 911 with any severe reactions post vaccine: Marland Kitchen Difficulty breathing  . Swelling of face and throat  . A fast heartbeat  . A bad rash all over body  . Dizziness and weakness   Immunizations Administered    Name Date Dose VIS Date Route   Pfizer COVID-19 Vaccine 06/01/2019  9:35 AM 0.3 mL 02/24/2019 Intramuscular   Manufacturer: [employer], [employer]   Lot: [id number]   NDC: [address]-1

## 2019-06-28 ENCOUNTER — Ambulatory Visit: Payer: BC Managed Care – PPO | Attending: Internal Medicine

## 2019-06-28 DIAGNOSIS — Z23 Encounter for immunization: Secondary | ICD-10-CM

## 2019-06-28 NOTE — Progress Notes (Signed)
   Covid-19 Vaccination Clinic  Name:  Marcus Myers    MRN: [id number] DOB: 05-30-1955  06/28/2019  [name] was observed post Covid-19 immunization for 15 minutes without incident. He was provided with Vaccine Information Sheet and instruction to access the V-Safe system.   [name] was instructed to call 911 with any severe reactions post vaccine: Marland Kitchen Difficulty breathing  . Swelling of face and throat  . A fast heartbeat  . A bad rash all over body  . Dizziness and weakness   Immunizations Administered    Name Date Dose VIS Date Route   Pfizer COVID-19 Vaccine 06/28/2019  8:16 AM 0.3 mL 02/24/2019 Intramuscular   Manufacturer: [employer], [employer]   Lot: [id number]   NDC: [address]-6

## 2021-06-05 IMAGING — DX LEFT KNEE - COMPLETE 4+ VIEW
4 series · 4 of 4 positions shown · non-contrast
Comparison: None.

CLINICAL DATA: Medial left knee pain for 1 week

EXAM:
RIGHT KNEE - 1-2 VIEW; LEFT KNEE - COMPLETE 4+ VIEW

[tunnel]
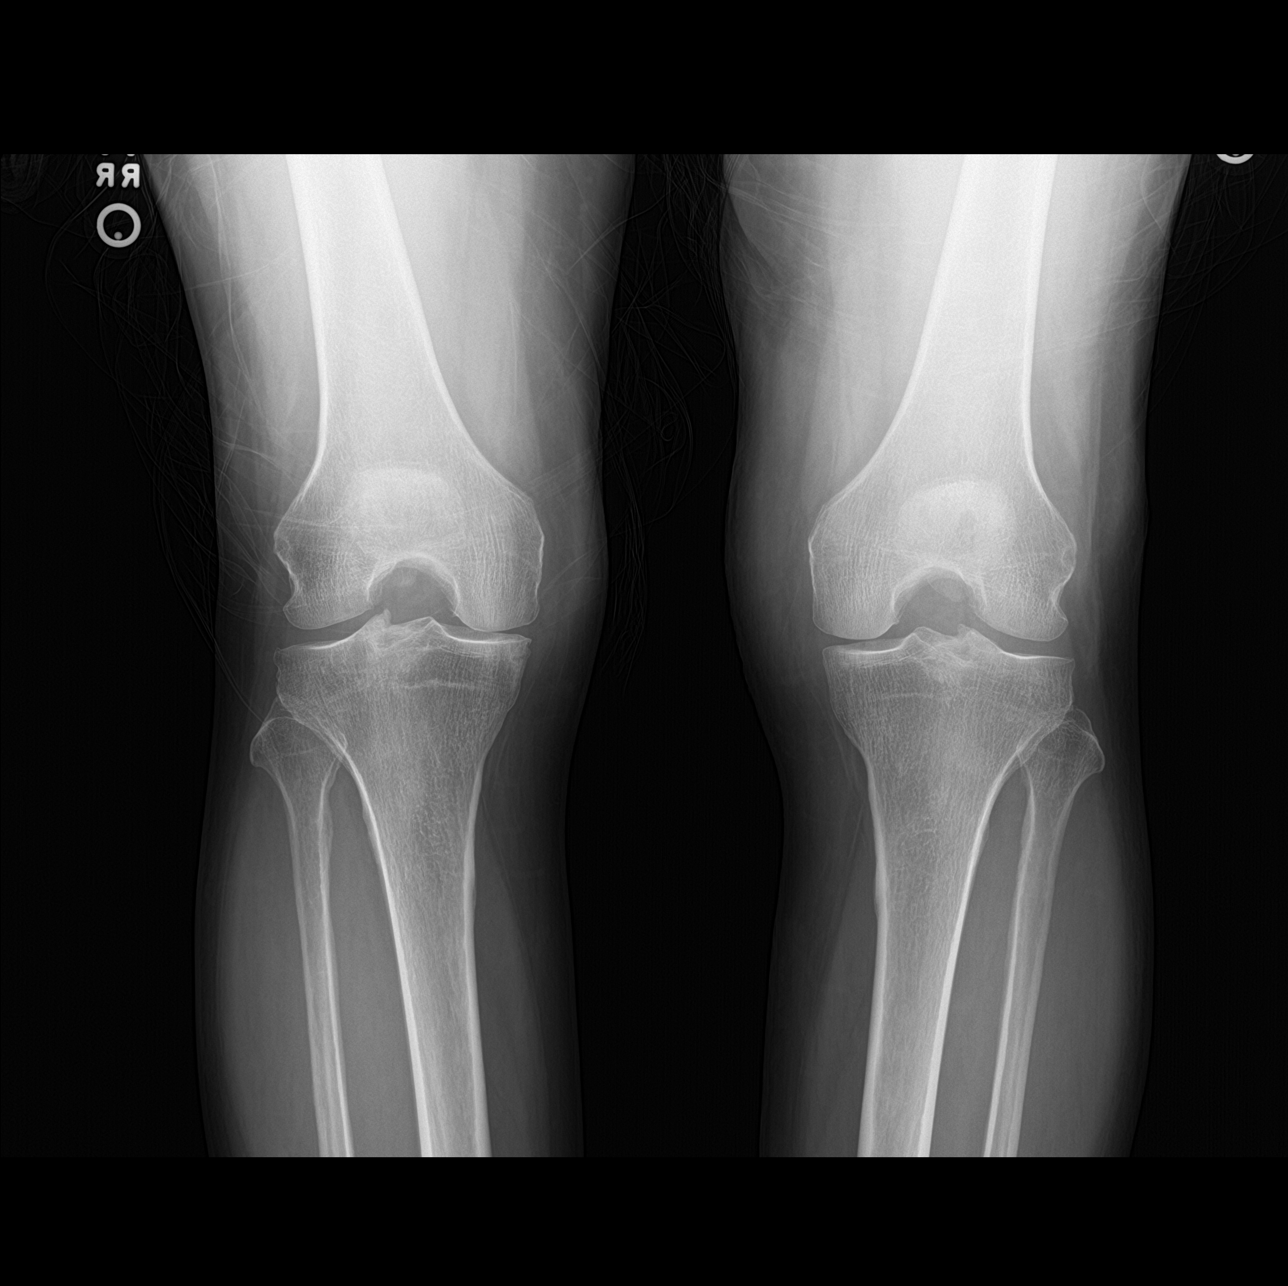

[knee lat]
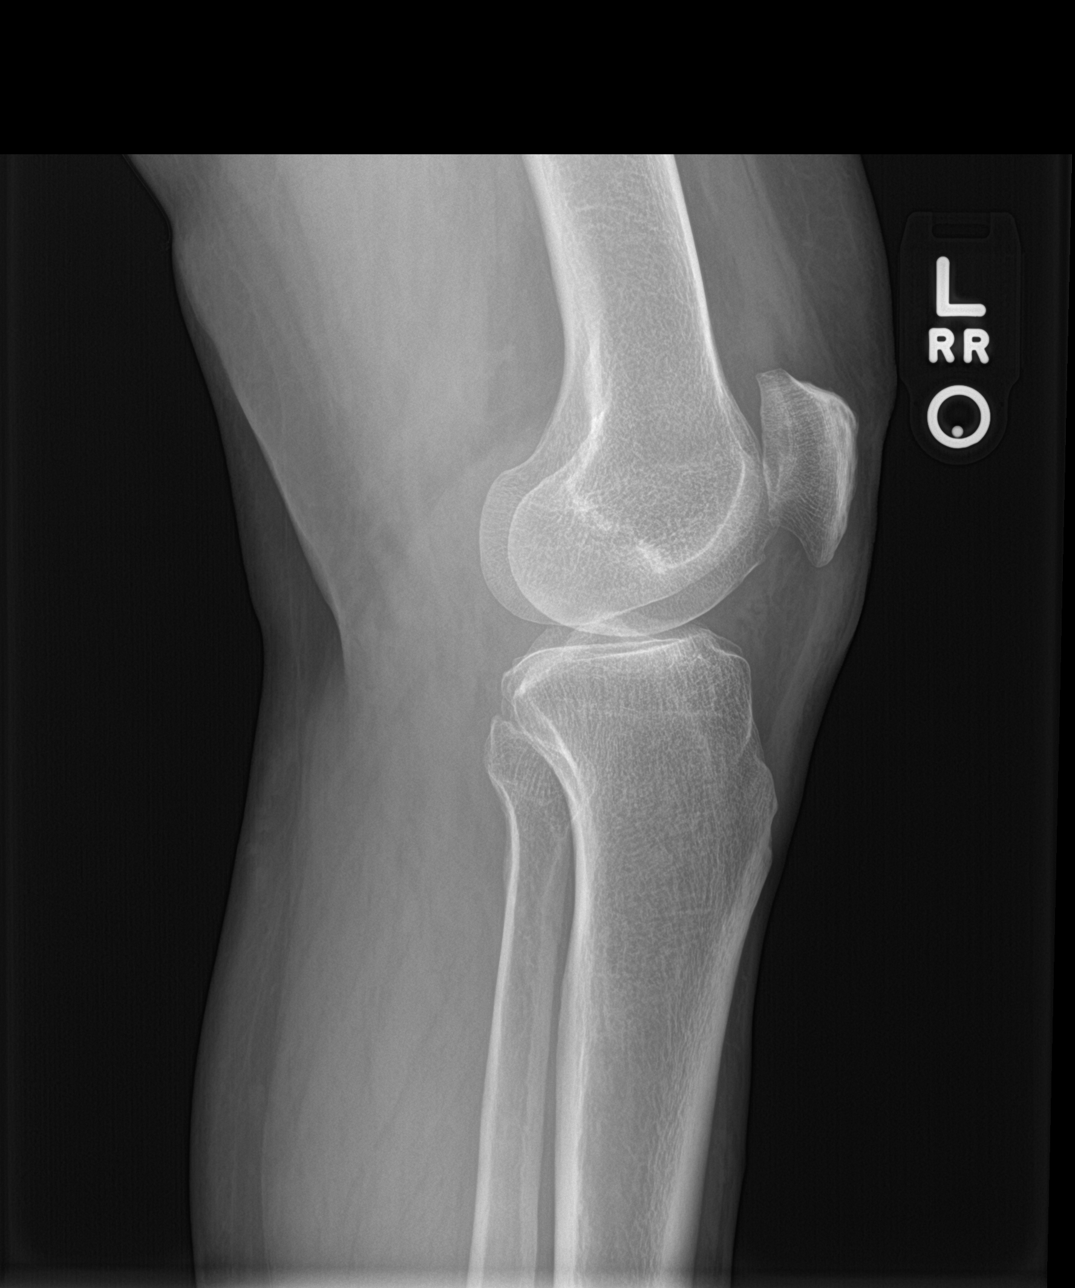

[knee sunrise]
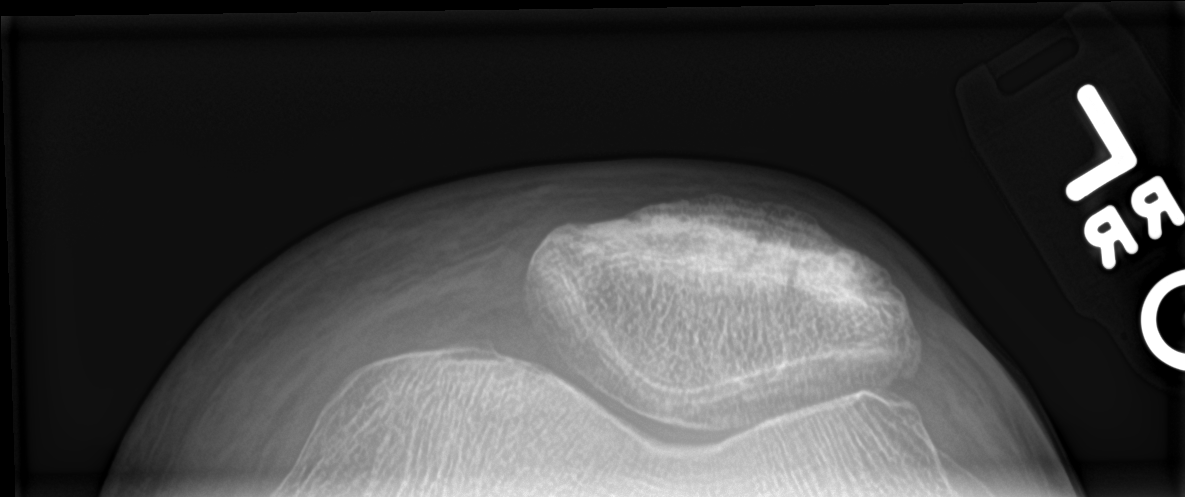

[knee ap bilat standing]
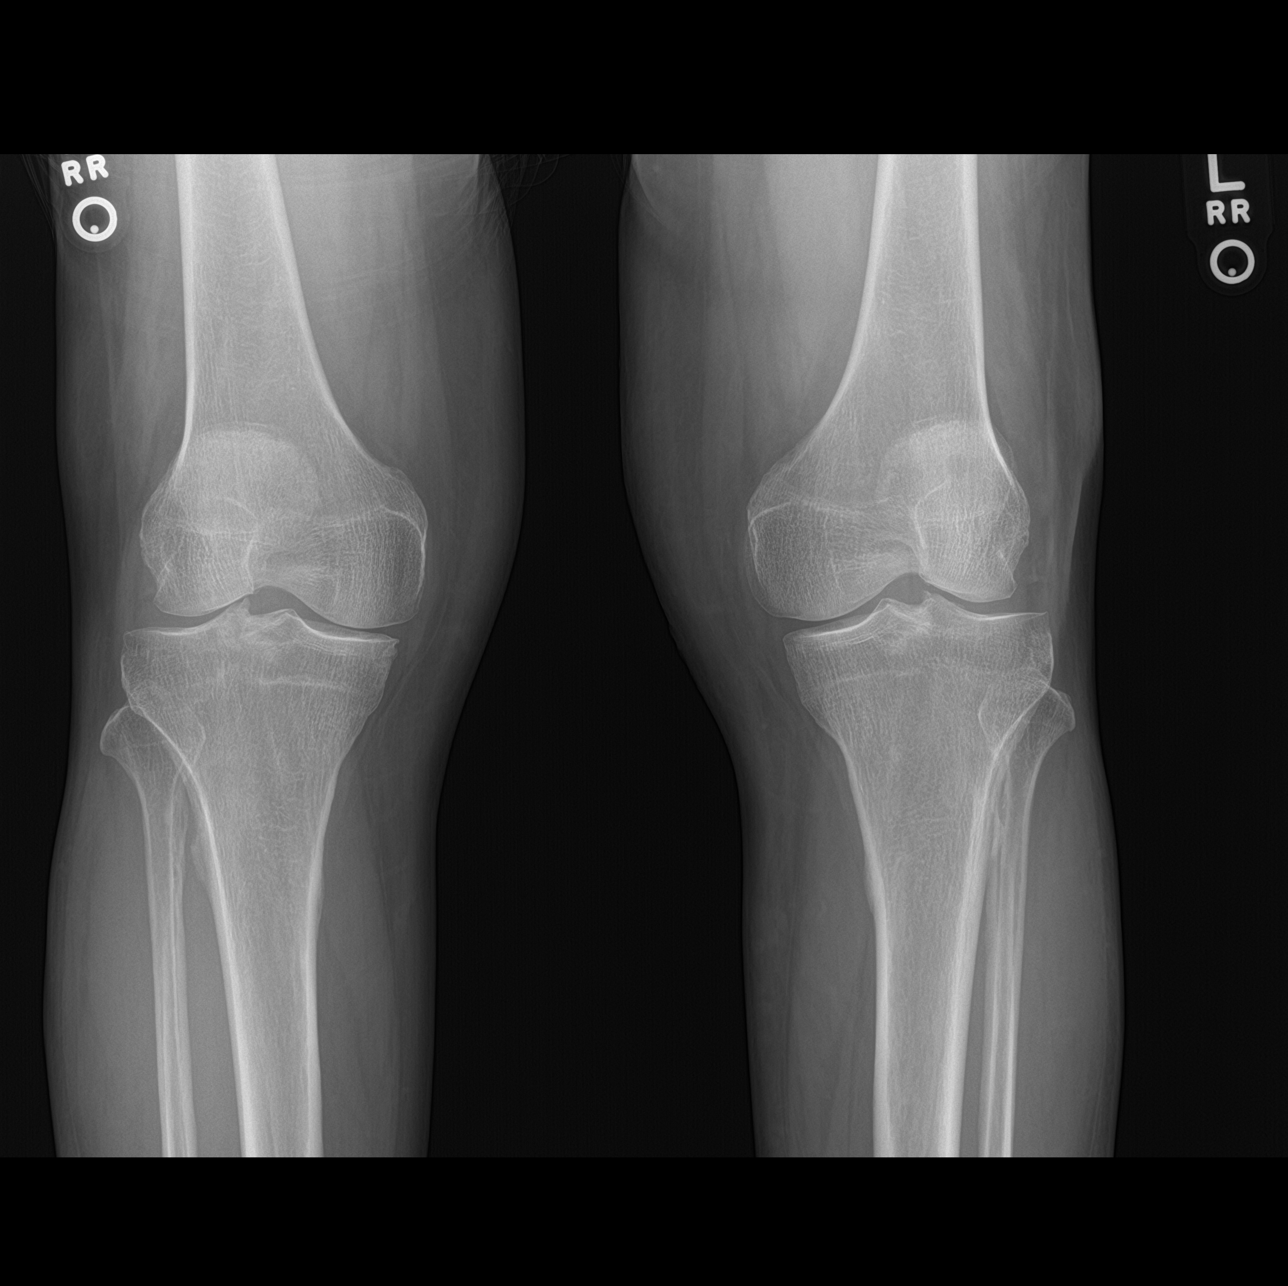

[4 of 4 positions shown; findings below may reference images not displayed]

FINDINGS: No fracture or dislocation of left knee. There is mild
tricompartmental joint space loss without significant osteophytosis.
There is a small, nonspecific left knee joint effusion. The right
knee is symmetric in appearance to the left and otherwise
unremarkable seen in frontal and tunnel views only.
IMPRESSION: No fracture or dislocation of left knee. There is mild
tricompartmental joint space loss without significant osteophytosis.
There is a small, nonspecific left knee joint effusion. The right
knee is symmetric in appearance to the left and otherwise
unremarkable seen in frontal and tunnel views only.
